# Patient Record
Sex: Female | Born: 1965
Health system: Southern US, Community
[De-identification: ages and names within clinical notes are randomized; demographics above are authoritative.]

---

## 2010-07-09 ENCOUNTER — Ambulatory Visit: Payer: Self-pay | Admitting: Unknown Physician Specialty

## 2011-08-10 ENCOUNTER — Ambulatory Visit: Payer: Self-pay | Admitting: Unknown Physician Specialty

## 2012-08-10 ENCOUNTER — Ambulatory Visit: Payer: Self-pay | Admitting: Physician Assistant

## 2013-08-13 ENCOUNTER — Ambulatory Visit: Payer: Self-pay | Admitting: Physician Assistant

## 2014-08-19 ENCOUNTER — Ambulatory Visit: Payer: Self-pay | Admitting: Physician Assistant

## 2016-06-28 ENCOUNTER — Emergency Department
Admission: EM | Admit: 2016-06-28 | Discharge: 2016-06-28 | Disposition: A | Discharge status: Home / Self Care | Payer: 59 | Attending: Student in an Organized Health Care Education/Training Program | Admitting: Student in an Organized Health Care Education/Training Program

## 2016-06-28 DIAGNOSIS — H6502 Acute serous otitis media, left ear: Secondary | ICD-10-CM

## 2016-06-28 DIAGNOSIS — H6592 Unspecified nonsuppurative otitis media, left ear: Secondary | ICD-10-CM | POA: Diagnosis not present

## 2016-06-28 DIAGNOSIS — Z87891 Personal history of nicotine dependence: Secondary | ICD-10-CM | POA: Insufficient documentation

## 2016-06-28 DIAGNOSIS — H9202 Otalgia, left ear: Secondary | ICD-10-CM | POA: Diagnosis present

## 2016-06-28 LAB — POCT RAPID STREP A: STREPTOCOCCUS, GROUP A SCREEN (DIRECT): NEGATIVE

## 2016-06-28 MED ORDER — AZITHROMYCIN 250 MG PO TABS: ORAL_TABLET | ORAL | 0 refills | Status: AC

## 2016-06-28 NOTE — Discharge Instructions (Signed)
You have a serous effusion (fluid behind the ear drum) on the left. There is no sign of infection at this time. Continue your previously prescribed steroid as directed. Use your steroid nasal spray (Flonase) along with Afrin as directed.  Consider starting Benadryl along with Pseudoephedrine (decongestant) for ear and sinus pressure relief. Follow-up with Dr. Linna Darner as needed.

## 2016-06-28 NOTE — ED Triage Notes (Signed)
Pt presents to ED with c/o LEFT otalgia with "bloody drainage". Pt's s/o reports being d/x'd with strep and believes pt might have gotten it from him. Pt states she spoke with [facility] and was started on prednisone today, started feeling better, and then noticed blood on a q-tip when she went to clean her ear. Pt also with c/o cough and sore throat since last Wednesday.

## 2016-06-29 NOTE — ED Provider Notes (Signed)
[facility] Emergency Department Provider Note ____________________________________________  Time seen: 2034  I have reviewed the triage vital signs and the nursing notes.  HISTORY  Chief Complaint  Otalgia  HPI Meredith Andrews is a 50 y.o. female existed ED with complaints of left otalgia, decreased hearing, and fullness. She describes what she thought was some blood to the ear when she used a cotton swab earlier. She reports her husband was diagnosed with strep throat, and is concerned that she might have gotten sick from being exposed to him. She did talk to an on-call doctor service. 2 rate left ear complaint. She was described prednisone which she started today at 60 mg. She reports some relief in her symptoms after the prednisone, but then noticed some drainage on the Q-tip when she cleaned her ear. She also describes some cough, sore throat since last week. Patient denies any outright fevers, vertigo, tinnitus, or syncope. She does report some sinus pressure and sore throat. She is otherwise concerned because she is scheduled to travel by air tomorrow afternoon.  History reviewed. No pertinent past medical history.  There are no active problems to display for this patient.  History reviewed. No pertinent surgical history.  Prior to Admission medications   Medication Sig Start Date End Date Taking? Authorizing Provider  azithromycin (ZITHROMAX Z-PAK) 250 MG tablet Take 2 tablets (500 mg) on  Day 1,  followed by 1 tablet (250 mg) once daily on Days 2 through 5. 06/28/16   Zamariyah Furukawa V Bacon Diarra Ceja, PA-C   Allergies Patient has no known allergies.  No family history on file.  Social History Social History  Substance Use Topics  . Smoking status: Former Research scientist (life sciences)  . Smokeless tobacco: Never Used  . Alcohol use No    Review of Systems  Constitutional: Negative for fever. Eyes: Negative for visual changes. ENT: Positive for sore throat. Left otalgia as  above. Cardiovascular: Negative for chest pain. Respiratory: Negative for shortness of breath. Gastrointestinal: Negative for abdominal pain, vomiting and diarrhea. Neurological: Negative for headaches, focal weakness or numbness. ____________________________________________  PHYSICAL EXAM:  VITAL SIGNS: ED Triage Vitals  Enc Vitals Group     BP 06/28/16 2117 (!) 157/87     Pulse Rate 06/28/16 2117 83     Resp 06/28/16 2117 16     Temp 06/28/16 2117 98.5 F (36.9 C)     Temp Source 06/28/16 2117 Oral     SpO2 06/28/16 2117 95 %     Weight 06/28/16 2118 205 lb (93 kg)     Height 06/28/16 2118 5\' 3"  (1.6 m)     Head Circumference --      Peak Flow --      Pain Score 06/28/16 2118 9     Pain Loc --      Pain Edu? --      Excl. in [place]? --    Constitutional: Alert and oriented. Well appearing and in no distress. Head: Normocephalic and atraumatic. Eyes: Conjunctivae are normal. PERRL. Normal extraocular movements Ears: Canals clear. TMs intact bilaterally. Left TM is erythematous, bulging, and serous effusion is appreciated. The canal is otherwise clear without any signs of trauma, edema, or otorrhea. Nose: No congestion/rhinorrhea/epistaxis. Mouth/Throat: Mucous membranes are moist. Uvula is midline and tonsils are flat. No oropharyngeal exudate or edema is noted. Neck: Supple. No thyromegaly. Hematological/Lymphatic/Immunological: No cervical lymphadenopathy. Cardiovascular: Normal rate, regular rhythm. Normal distal pulses. Respiratory: Normal respiratory effort. No wheezes/rales/rhonchi. Neurologic:  Normal gait without ataxia.  Normal speech and language. No gross focal neurologic deficits are appreciated. ____________________________________________   LABS (pertinent positives/negatives) Labs Reviewed  CULTURE, GROUP A STREP [facility])  POCT RAPID STREP A  Rapid Strep - Negative ____________________________________________  INITIAL IMPRESSION / ASSESSMENT AND PLAN / ED  COURSE  Patient with left ear pain due to an acute serous otitis media. The TM is otherwise intact without any signs of rupture or damage. Patient is advised to do over-the-counter decongestants, allergy medicine, and nasal steroid. She is discharged at this time with a prescription for azithromycin which she may dose if symptoms do not improve within the next 0.4-48 hours. She will follow-up with [facility] upon returning for ongoing symptom management. Precautions and instructions in case of an acute, spontaneous TM rupture are provided to the patient.   Clinical Course    ____________________________________________  FINAL CLINICAL IMPRESSION(S) / ED DIAGNOSES  Final diagnoses:  Acute serous otitis media of left ear, recurrence not specified  Left ear pain      Melvenia Needles, PA-C 06/29/16 1818    Merlyn Lot, MD 06/29/16 2044

## 2016-07-02 LAB — CULTURE, GROUP A STREP (THRC)

## 2016-07-12 DIAGNOSIS — H6982 Other specified disorders of Eustachian tube, left ear: Secondary | ICD-10-CM | POA: Diagnosis not present

## 2016-07-12 DIAGNOSIS — H6502 Acute serous otitis media, left ear: Secondary | ICD-10-CM | POA: Diagnosis not present

## 2016-07-12 DIAGNOSIS — H902 Conductive hearing loss, unspecified: Secondary | ICD-10-CM | POA: Diagnosis not present

## 2017-04-05 DIAGNOSIS — Z1231 Encounter for screening mammogram for malignant neoplasm of breast: Secondary | ICD-10-CM | POA: Diagnosis not present

## 2017-04-05 DIAGNOSIS — Z0000 Encounter for general adult medical examination without abnormal findings: Secondary | ICD-10-CM | POA: Diagnosis not present

## 2017-04-05 DIAGNOSIS — Z23 Encounter for immunization: Secondary | ICD-10-CM | POA: Diagnosis not present

## 2017-05-09 ENCOUNTER — Other Ambulatory Visit: Payer: Self-pay | Admitting: Physician Assistant

## 2017-05-09 DIAGNOSIS — Z1231 Encounter for screening mammogram for malignant neoplasm of breast: Secondary | ICD-10-CM

## 2017-05-20 DIAGNOSIS — L403 Pustulosis palmaris et plantaris: Secondary | ICD-10-CM | POA: Diagnosis not present

## 2017-05-30 DIAGNOSIS — Z1211 Encounter for screening for malignant neoplasm of colon: Secondary | ICD-10-CM | POA: Diagnosis not present

## 2017-06-02 ENCOUNTER — Ambulatory Visit
Admission: RE | Admit: 2017-06-02 | Discharge: 2017-06-02 | Disposition: A | Discharge status: Home / Self Care | Payer: 59 | Source: Ambulatory Visit | Attending: Physician Assistant | Admitting: Physician Assistant

## 2017-06-02 DIAGNOSIS — Z1231 Encounter for screening mammogram for malignant neoplasm of breast: Secondary | ICD-10-CM | POA: Insufficient documentation

## 2017-06-17 DIAGNOSIS — L403 Pustulosis palmaris et plantaris: Secondary | ICD-10-CM | POA: Diagnosis not present

## 2017-06-20 DIAGNOSIS — D126 Benign neoplasm of colon, unspecified: Secondary | ICD-10-CM | POA: Diagnosis not present

## 2017-06-20 DIAGNOSIS — K5730 Diverticulosis of large intestine without perforation or abscess without bleeding: Secondary | ICD-10-CM | POA: Diagnosis not present

## 2017-06-20 DIAGNOSIS — K648 Other hemorrhoids: Secondary | ICD-10-CM | POA: Diagnosis not present

## 2017-06-20 DIAGNOSIS — Z1211 Encounter for screening for malignant neoplasm of colon: Secondary | ICD-10-CM | POA: Diagnosis not present

## 2017-06-20 DIAGNOSIS — K635 Polyp of colon: Secondary | ICD-10-CM | POA: Diagnosis not present

## 2018-06-06 ENCOUNTER — Other Ambulatory Visit: Payer: Self-pay | Admitting: Physician Assistant

## 2018-06-06 DIAGNOSIS — Z1231 Encounter for screening mammogram for malignant neoplasm of breast: Secondary | ICD-10-CM

## 2018-06-20 ENCOUNTER — Ambulatory Visit
Admission: RE | Admit: 2018-06-20 | Discharge: 2018-06-20 | Disposition: A | Discharge status: Home / Self Care | Payer: 59 | Source: Ambulatory Visit | Attending: Physician Assistant | Admitting: Physician Assistant

## 2018-06-20 DIAGNOSIS — Z1231 Encounter for screening mammogram for malignant neoplasm of breast: Secondary | ICD-10-CM | POA: Insufficient documentation

## 2019-08-09 ENCOUNTER — Other Ambulatory Visit: Payer: Self-pay | Admitting: Physician Assistant

## 2019-08-09 DIAGNOSIS — Z1231 Encounter for screening mammogram for malignant neoplasm of breast: Secondary | ICD-10-CM

## 2019-09-05 ENCOUNTER — Ambulatory Visit
Admission: RE | Admit: 2019-09-05 | Discharge: 2019-09-05 | Disposition: A | Discharge status: Home / Self Care | Payer: 59 | Source: Ambulatory Visit | Attending: Physician Assistant | Admitting: Physician Assistant

## 2019-09-05 DIAGNOSIS — Z1231 Encounter for screening mammogram for malignant neoplasm of breast: Secondary | ICD-10-CM | POA: Diagnosis present

## 2021-05-06 ENCOUNTER — Other Ambulatory Visit: Payer: Self-pay | Admitting: Physician Assistant

## 2021-05-06 DIAGNOSIS — Z1231 Encounter for screening mammogram for malignant neoplasm of breast: Secondary | ICD-10-CM

## 2021-05-10 IMAGING — MG DIGITAL SCREENING BILAT W/ TOMO W/ CAD
6 of 10 series · 6 of 30 positions shown · non-contrast
Comparison: Previous exam(s).

CLINICAL DATA: Screening.

EXAM:
DIGITAL SCREENING BILATERAL MAMMOGRAM WITH TOMO AND CAD

[R CC synth-2D]
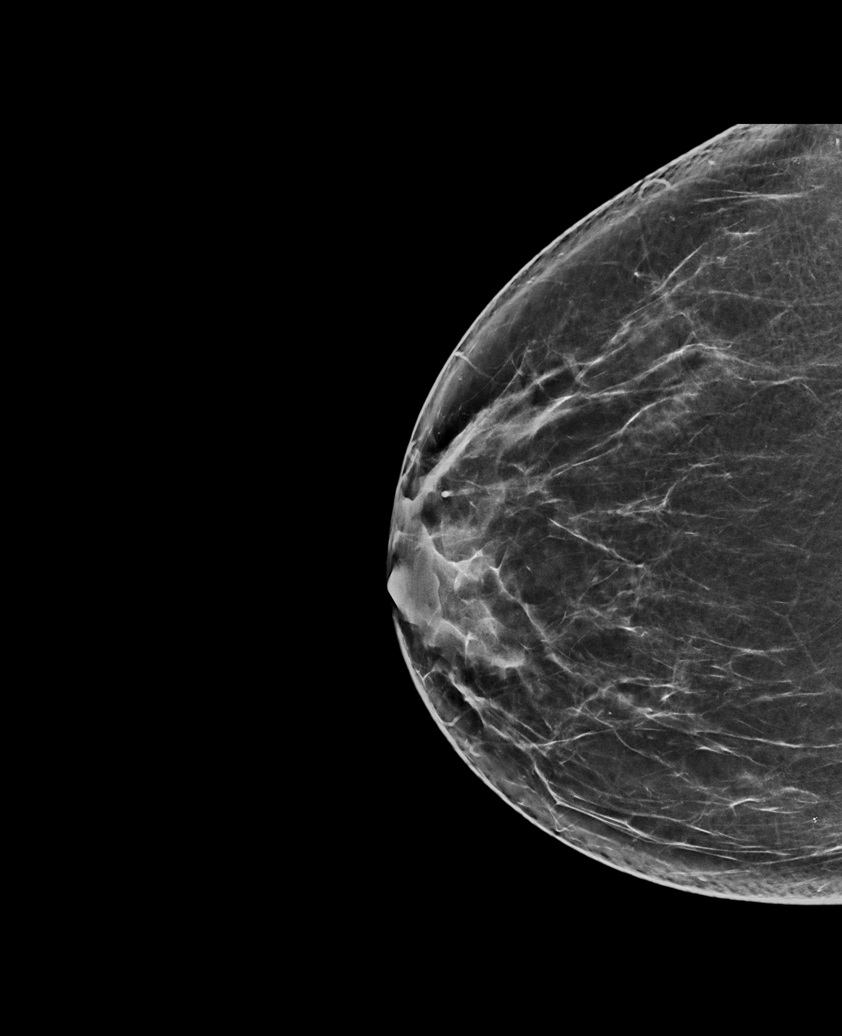

[R MLO synth-2D (1 of 2)]
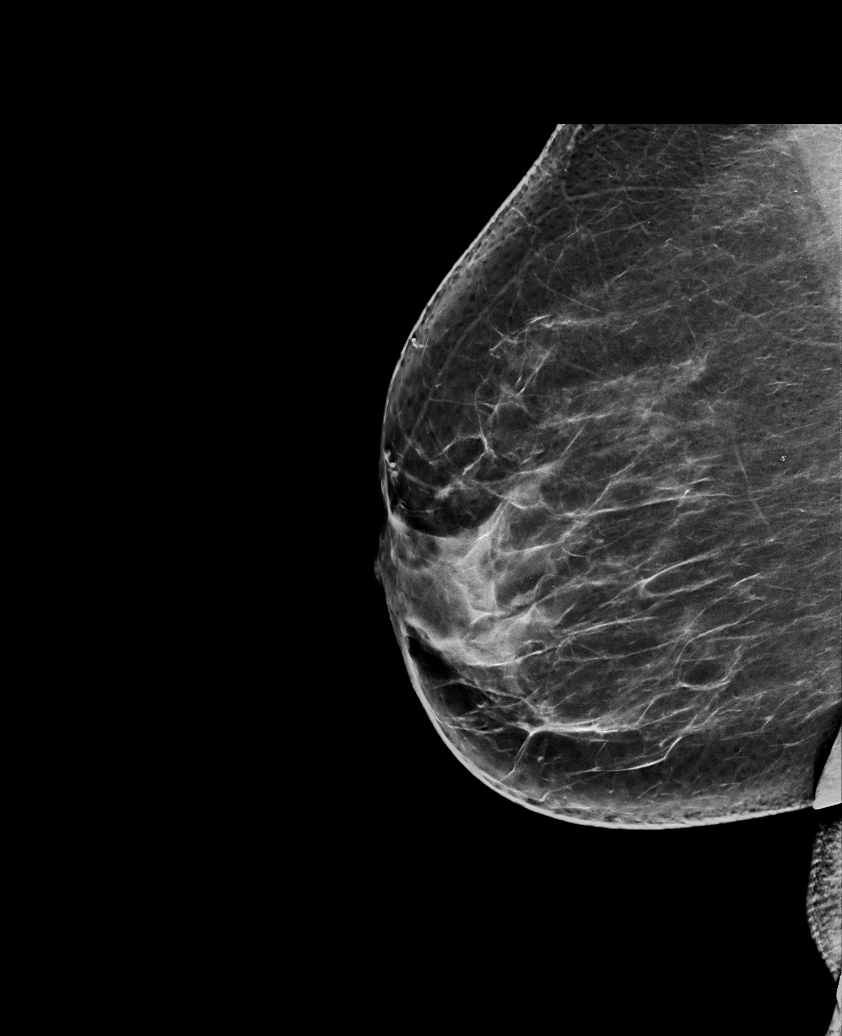

[L CC synth-2D]
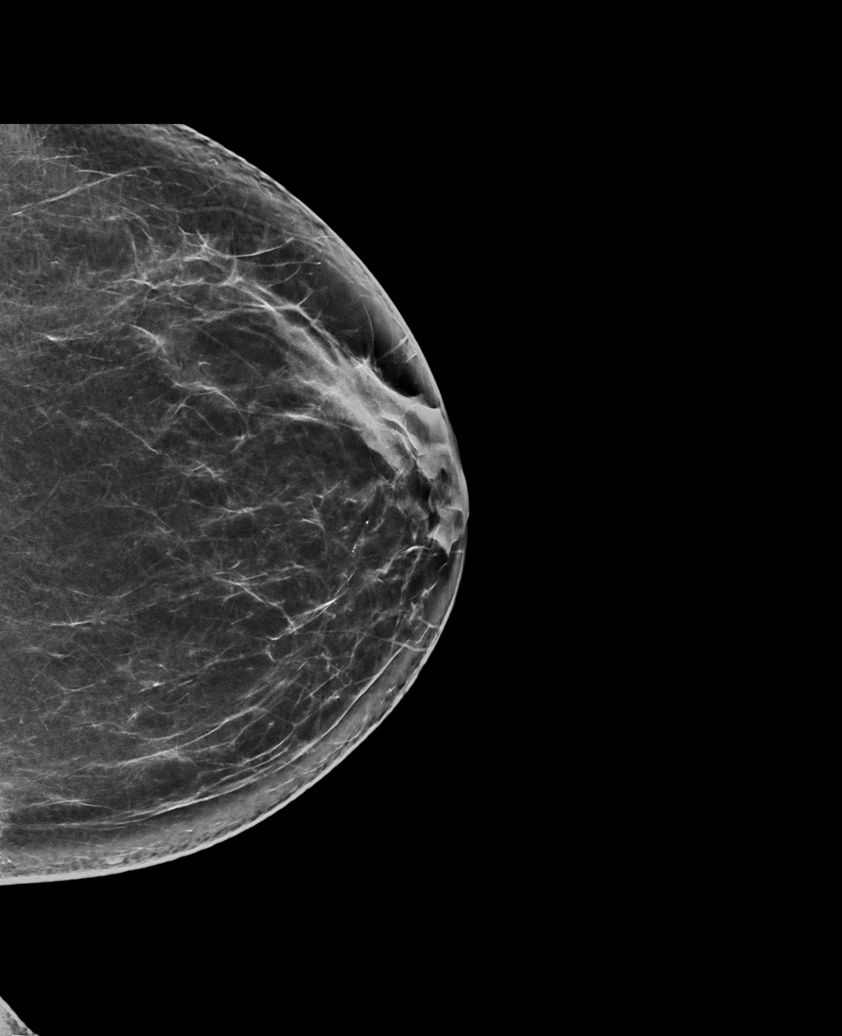

[R MLO synth-2D (2 of 2)]
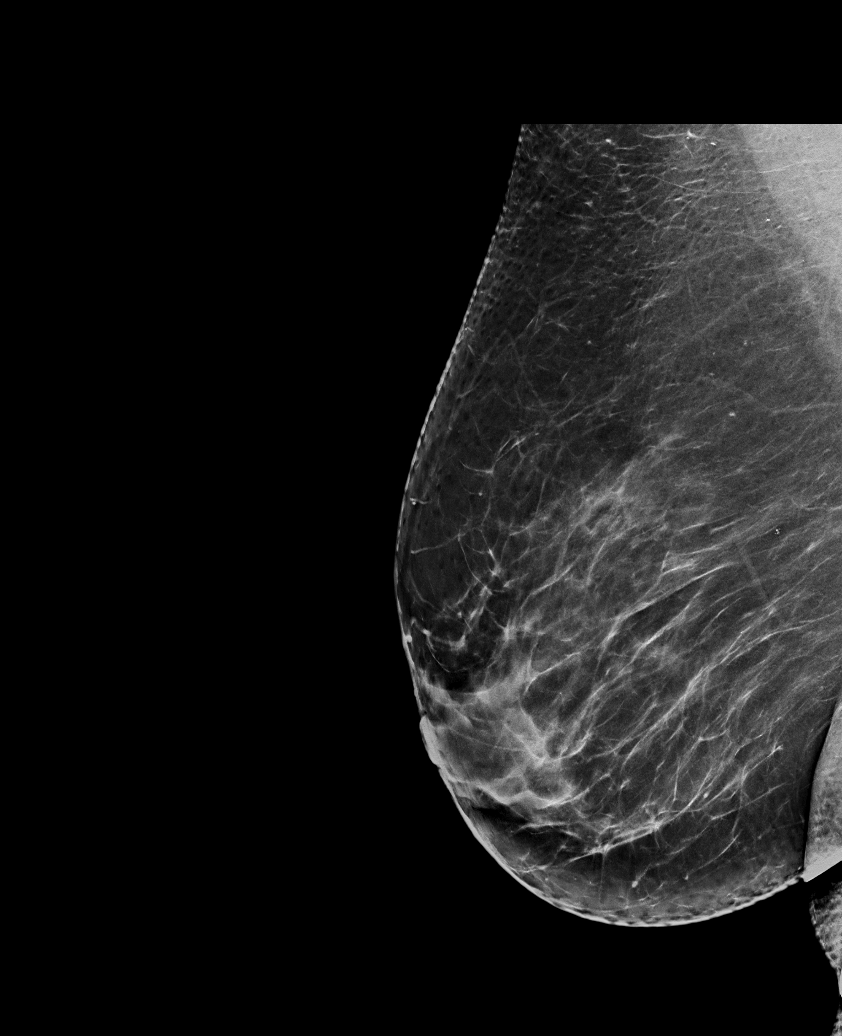

[L MLO synth-2D]
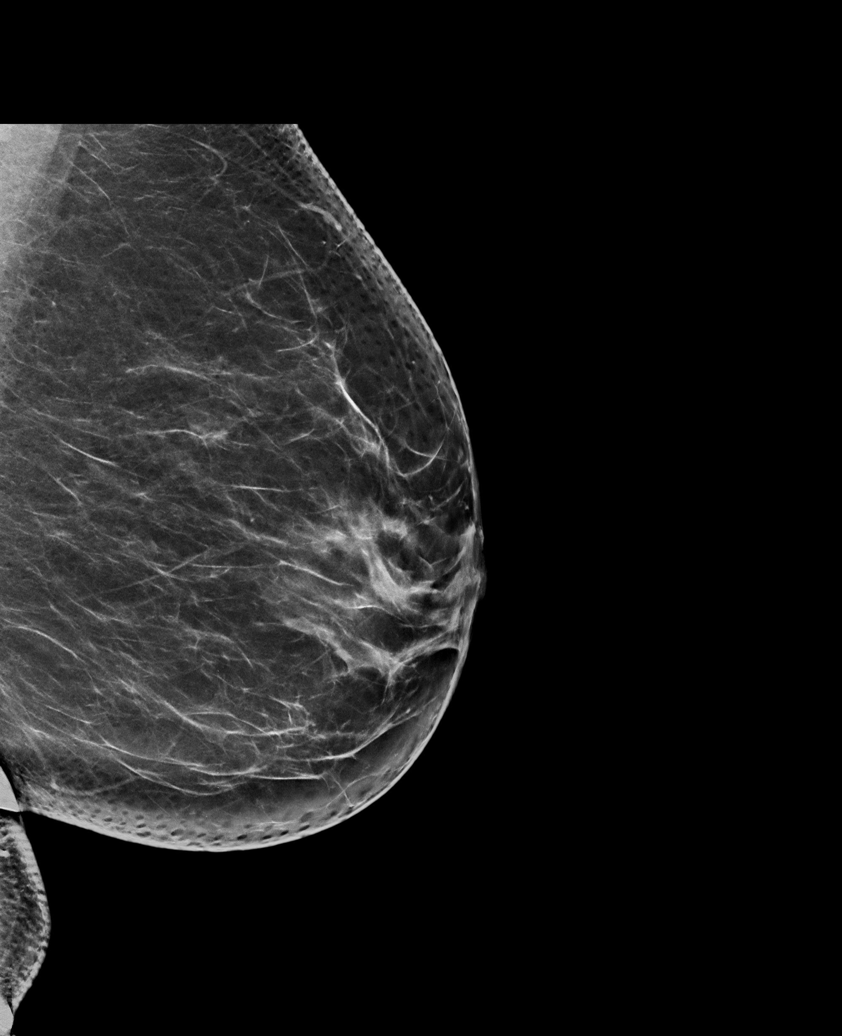

[L CC tomo · tomo slice 43/84.0]
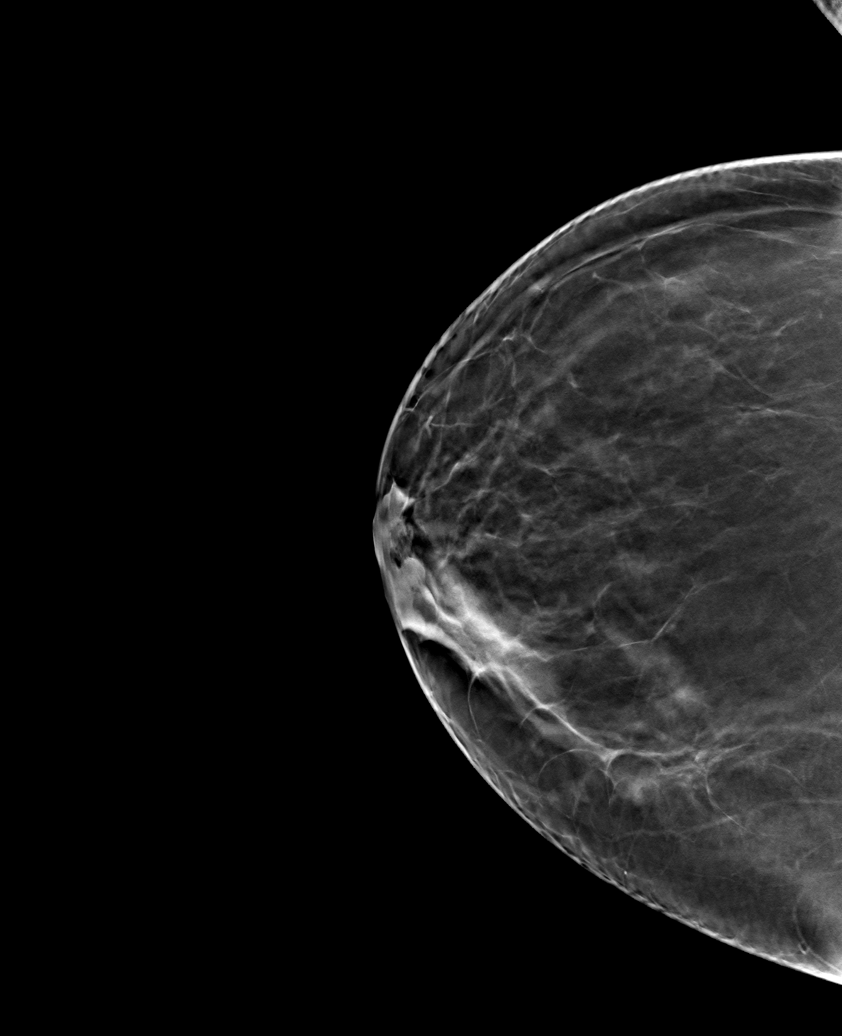

[6 of 30 positions shown; findings below may reference images not displayed]

ACR Breast Density Category b: There are scattered areas of
fibroglandular density.
FINDINGS: There are no findings suspicious for malignancy. Images were
processed with CAD.
IMPRESSION: No mammographic evidence of malignancy. A result letter of this
screening mammogram will be mailed directly to the patient.

RECOMMENDATION:
Screening mammogram in one year. (Code:[id number])

BI-RADS CATEGORY  1: Negative.

## 2022-05-11 ENCOUNTER — Other Ambulatory Visit: Payer: Self-pay | Admitting: Physician Assistant

## 2022-05-11 DIAGNOSIS — Z1231 Encounter for screening mammogram for malignant neoplasm of breast: Secondary | ICD-10-CM

## 2022-07-20 ENCOUNTER — Ambulatory Visit
Admission: RE | Admit: 2022-07-20 | Discharge: 2022-07-20 | Disposition: A | Discharge status: Home / Self Care | Payer: 59 | Source: Ambulatory Visit | Attending: Physician Assistant | Admitting: Physician Assistant

## 2022-07-20 DIAGNOSIS — Z1231 Encounter for screening mammogram for malignant neoplasm of breast: Secondary | ICD-10-CM | POA: Diagnosis not present

## 2023-05-12 ENCOUNTER — Other Ambulatory Visit: Payer: Self-pay | Admitting: Physician Assistant

## 2023-05-12 DIAGNOSIS — Z1231 Encounter for screening mammogram for malignant neoplasm of breast: Secondary | ICD-10-CM

## 2023-12-12 ENCOUNTER — Ambulatory Visit
Admission: RE | Admit: 2023-12-12 | Discharge: 2023-12-12 | Disposition: A | Discharge status: Home / Self Care | Source: Ambulatory Visit | Attending: Physician Assistant | Admitting: Physician Assistant

## 2023-12-12 DIAGNOSIS — Z1231 Encounter for screening mammogram for malignant neoplasm of breast: Secondary | ICD-10-CM | POA: Diagnosis present
